# Patient Record
Sex: Female | Born: 1960 | Race: Black or African American | Hispanic: No | Marital: Married | Smoking: Former smoker
Health system: Southern US, Community
[De-identification: ages and names within clinical notes are randomized; demographics above are authoritative.]

## PROBLEM LIST (undated history)

## (undated) DIAGNOSIS — E119 Type 2 diabetes mellitus without complications: Secondary | ICD-10-CM

## (undated) DIAGNOSIS — M1990 Unspecified osteoarthritis, unspecified site: Secondary | ICD-10-CM

## (undated) DIAGNOSIS — K219 Gastro-esophageal reflux disease without esophagitis: Secondary | ICD-10-CM

## (undated) DIAGNOSIS — E669 Obesity, unspecified: Secondary | ICD-10-CM

## (undated) DIAGNOSIS — E7800 Pure hypercholesterolemia, unspecified: Secondary | ICD-10-CM

## (undated) DIAGNOSIS — C801 Malignant (primary) neoplasm, unspecified: Secondary | ICD-10-CM

## (undated) DIAGNOSIS — I10 Essential (primary) hypertension: Secondary | ICD-10-CM

## (undated) HISTORY — PX: BLADDER SURGERY: SHX569

## (undated) HISTORY — PX: ABDOMINAL SURGERY: SHX537

---

## 2016-07-25 ENCOUNTER — Emergency Department (HOSPITAL_BASED_OUTPATIENT_CLINIC_OR_DEPARTMENT_OTHER)
Admission: EM | Admit: 2016-07-25 | Discharge: 2016-07-25 | Disposition: A | Discharge status: Home / Self Care | Payer: Medicare Other | Attending: Emergency Medicine | Admitting: Emergency Medicine

## 2016-07-25 ENCOUNTER — Encounter (HOSPITAL_BASED_OUTPATIENT_CLINIC_OR_DEPARTMENT_OTHER): Payer: Self-pay | Admitting: Emergency Medicine

## 2016-07-25 DIAGNOSIS — J0100 Acute maxillary sinusitis, unspecified: Secondary | ICD-10-CM | POA: Diagnosis not present

## 2016-07-25 DIAGNOSIS — I10 Essential (primary) hypertension: Secondary | ICD-10-CM | POA: Diagnosis not present

## 2016-07-25 DIAGNOSIS — R079 Chest pain, unspecified: Secondary | ICD-10-CM | POA: Diagnosis not present

## 2016-07-25 DIAGNOSIS — Z87891 Personal history of nicotine dependence: Secondary | ICD-10-CM | POA: Diagnosis not present

## 2016-07-25 DIAGNOSIS — Z7984 Long term (current) use of oral hypoglycemic drugs: Secondary | ICD-10-CM | POA: Diagnosis not present

## 2016-07-25 DIAGNOSIS — Z859 Personal history of malignant neoplasm, unspecified: Secondary | ICD-10-CM | POA: Insufficient documentation

## 2016-07-25 DIAGNOSIS — E119 Type 2 diabetes mellitus without complications: Secondary | ICD-10-CM | POA: Insufficient documentation

## 2016-07-25 DIAGNOSIS — R05 Cough: Secondary | ICD-10-CM | POA: Diagnosis present

## 2016-07-25 HISTORY — DX: Gastro-esophageal reflux disease without esophagitis: K21.9

## 2016-07-25 HISTORY — DX: Pure hypercholesterolemia, unspecified: E78.00

## 2016-07-25 HISTORY — DX: Unspecified osteoarthritis, unspecified site: M19.90

## 2016-07-25 HISTORY — DX: Essential (primary) hypertension: I10

## 2016-07-25 HISTORY — DX: Malignant (primary) neoplasm, unspecified: C80.1

## 2016-07-25 HISTORY — DX: Obesity, unspecified: E66.9

## 2016-07-25 HISTORY — DX: Type 2 diabetes mellitus without complications: E11.9

## 2016-07-25 MED ORDER — IBUPROFEN 800 MG PO TABS
800.0000 mg | ORAL_TABLET | Freq: Once | ORAL | Status: AC
  Administered 2016-07-25: 800 mg via ORAL
  Filled 2016-07-25: qty 1

## 2016-07-25 MED ORDER — ACETAMINOPHEN 500 MG PO TABS
1000.0000 mg | ORAL_TABLET | Freq: Once | ORAL | Status: AC
  Administered 2016-07-25: 1000 mg via ORAL
  Filled 2016-07-25: qty 2

## 2016-07-25 MED ORDER — HYDROCOD POLST-CPM POLST ER 10-8 MG/5ML PO SUER
5.0000 mL | Freq: Once | ORAL | Status: AC
  Administered 2016-07-25: 5 mL via ORAL
  Filled 2016-07-25: qty 5

## 2016-07-25 MED ORDER — AMOXICILLIN-POT CLAVULANATE 875-125 MG PO TABS
1.0000 | ORAL_TABLET | Freq: Once | ORAL | Status: AC
  Administered 2016-07-25: 1 via ORAL
  Filled 2016-07-25: qty 1

## 2016-07-25 MED ORDER — AMOXICILLIN-POT CLAVULANATE 875-125 MG PO TABS: 1.0000 | ORAL_TABLET | Freq: Two times a day (BID) | ORAL | 0 refills | Status: DC

## 2016-07-25 NOTE — ED Notes (Signed)
Pt ambulated here in ED. HR 114, SpO2 96% without any breathing difficulty and no increase WOB.

## 2016-07-25 NOTE — ED Provider Notes (Signed)
[facility] DEPT [facility] Provider Note   CSN: [id number] Arrival date & time: 07/25/16  1744  By signing my name below, I, Dora Sims, attest that this documentation has been prepared under the direction and in the presence of physician practitioner, Deno Etienne, DO. Electronically Signed: Dora Sims, Scribe. 07/25/2016. 6:24 PM.  History   Chief Complaint Chief Complaint  Patient presents with  . Cough    The history is provided by the patient. No language interpreter was used.  Cough  This is a new problem. The current episode started more than 1 week ago. The problem occurs every few minutes. The problem has been gradually worsening. The cough is non-productive. Maximum temperature: subjective. The fever has been present for 5 days or more (intermittent). Associated symptoms include chest pain (secondary to cough), chills, sweats, sore throat and myalgias (generalized). Pertinent negatives include no ear pain and no shortness of breath. Treatments tried: TheraFlu. The treatment provided no relief. She is not a smoker (formerly).     HPI Comments: Tina Barker is a 56 y.o. female who presents to the Emergency Department complaining of an intermittent, worsening, dry cough for the last 3 weeks. She notes associated sore throat, fever, chills, diaphoresis, sinus pain, generalized myalgias, and chest pain secondary to her cough. Pt notes her cough has worsened most significantly this past week. She has tried TheraFlu with no improvement of her symptoms. Pt reports bilateral lower leg swelling at baseline and notes no acute change in this condition; she did not take her fluid pills today. She denies ear pain, nausea, vomiting, SOB, or any other associated symptoms.  Past Medical History:  Diagnosis Date  . Arthritis   . Cancer ([place])   . Diabetes mellitus without complication ([place])   . GERD (gastroesophageal reflux disease)   . High cholesterol   . Hypertension   . Obesity     There  are no active problems to display for this patient.   Past Surgical History:  Procedure Laterality Date  . ABDOMINAL SURGERY    . BLADDER SURGERY      OB History    No data available       Home Medications    Prior to Admission medications   Medication Sig Start Date End Date Taking? Authorizing Provider  metFORMIN (GLUCOPHAGE) 1000 MG tablet Take 1,000 mg by mouth 2 (two) times daily with a meal.   Yes Historical Provider, MD  amoxicillin-clavulanate (AUGMENTIN) 875-125 MG tablet Take 1 tablet by mouth every 12 (twelve) hours. 07/25/16   Deno Etienne, DO    Family History No family history on file.  Social History Social History  Substance Use Topics  . Smoking status: Former Research scientist (life sciences)  . Smokeless tobacco: Never Used  . Alcohol use No     Allergies   Patient has no known allergies.   Review of Systems Review of Systems  Constitutional: Positive for chills, diaphoresis and fever.  HENT: Positive for sinus pain and sore throat. Negative for ear pain.   Respiratory: Positive for cough. Negative for shortness of breath.   Cardiovascular: Positive for chest pain (secondary to cough) and leg swelling (bilateral, baseline).  Gastrointestinal: Negative for nausea and vomiting.  Musculoskeletal: Positive for myalgias (generalized).  All other systems reviewed and are negative.    Physical Exam Updated Vital Signs BP 146/89 (BP Location: Right Arm)   Pulse 94   Temp 99.4 F (37.4 C) (Oral)   Resp 20   Ht 5\' 8"  (1.727 m)  Wt (!) 304 lb (137.9 kg)   SpO2 97%   BMI 46.22 kg/m   Physical Exam  Constitutional: She is oriented to person, place, and time. She appears well-developed and well-nourished. No distress.  HENT:  Head: Normocephalic and atraumatic.  Left Ear: Tympanic membrane is erythematous. A middle ear effusion is present.  Nose: Right sinus exhibits maxillary sinus tenderness.  Swollen turbinates. Postnasal drip. Tenderness to percussion of right  maxillary sinus. Left TM has an effusion and is erythematous.   Eyes: EOM are normal. Pupils are equal, round, and reactive to light.  Neck: Normal range of motion. Neck supple.  Cardiovascular: Normal rate and regular rhythm.  Exam reveals no gallop and no friction rub.   No murmur heard. Pulmonary/Chest: Effort normal. She has no wheezes. She has no rales.  Clear lungs.  Abdominal: Soft. She exhibits no distension. There is no tenderness.  Musculoskeletal: She exhibits no edema or tenderness.  Neurological: She is alert and oriented to person, place, and time.  Skin: Skin is warm and dry. She is not diaphoretic.  Psychiatric: She has a normal mood and affect. Her behavior is normal.  Nursing note and vitals reviewed.    ED Treatments / Results  Labs (all labs ordered are listed, but only abnormal results are displayed) Labs Reviewed - No data to display  EKG  EKG Interpretation None       Radiology No results found.  Procedures Procedures (including critical care time)  DIAGNOSTIC STUDIES: Oxygen Saturation is 93% on RA, adequate by my interpretation.    COORDINATION OF CARE: 6:29 PM Discussed treatment plan with pt at bedside and pt agreed to plan.  Medications Ordered in ED Medications  amoxicillin-clavulanate (AUGMENTIN) 875-125 MG per tablet 1 tablet (1 tablet Oral Given 07/25/16 1910)  chlorpheniramine-HYDROcodone (TUSSIONEX) 10-8 MG/5ML suspension 5 mL (5 mLs Oral Given 07/25/16 1911)  acetaminophen (TYLENOL) tablet 1,000 mg (1,000 mg Oral Given 07/25/16 1910)  ibuprofen (ADVIL,MOTRIN) tablet 800 mg (800 mg Oral Given 07/25/16 1910)     Initial Impression / Assessment and Plan / ED Course  I have reviewed the triage vital signs and the nursing notes.  Pertinent labs & imaging results that were available during my care of the patient were reviewed by me and considered in my medical decision making (see chart for details).  Clinical Course     56 yo F with a cc  of a cough.  Going on for 3 weeks. Having severe sinus tenderness on exam. Start on abx.   11:33 PM:  I have discussed the diagnosis/risks/treatment options with the patient and family and believe the pt to be eligible for discharge home to follow-up with . We also discussed returning to the ED immediately if new or worsening sx occur. We discussed the sx which are most concerning (e.g., sudden worsening pain, fever, inability to tolerate by mouth) that necessitate immediate return. Medications administered to the patient during their visit and any new prescriptions provided to the patient are listed below.  Medications given during this visit Medications  amoxicillin-clavulanate (AUGMENTIN) 875-125 MG per tablet 1 tablet (1 tablet Oral Given 07/25/16 1910)  chlorpheniramine-HYDROcodone (TUSSIONEX) 10-8 MG/5ML suspension 5 mL (5 mLs Oral Given 07/25/16 1911)  acetaminophen (TYLENOL) tablet 1,000 mg (1,000 mg Oral Given 07/25/16 1910)  ibuprofen (ADVIL,MOTRIN) tablet 800 mg (800 mg Oral Given 07/25/16 1910)     The patient appears reasonably screen and/or stabilized for discharge and I doubt any other medical condition or other [facility]  requiring further screening, evaluation, or treatment in the ED at this time prior to discharge.    Final Clinical Impressions(s) / ED Diagnoses   Final diagnoses:  Acute non-recurrent maxillary sinusitis    New Prescriptions Discharge Medication List as of 07/25/2016  7:13 PM    START taking these medications   Details  amoxicillin-clavulanate (AUGMENTIN) 875-125 MG tablet Take 1 tablet by mouth every 12 (twelve) hours., Starting Tue 07/25/2016, Print       I personally performed the services described in this documentation, which was scribed in my presence. The recorded information has been reviewed and is accurate.     Deno Etienne, DO 07/25/16 2333

## 2016-07-25 NOTE — Discharge Instructions (Signed)
Take tylenol 2 pills 4 times a day and motrin 4 pills 3 times a day.  Drink plenty of fluids.  Return for worsening shortness of breath, headache, confusion. Follow up with your family doctor.   

## 2016-07-25 NOTE — ED Triage Notes (Signed)
Sinus congestion and runny nose with cough and sore throat x3 weeks. Taking OTC meds.  Has not seen pmd.

## 2016-07-31 ENCOUNTER — Emergency Department (HOSPITAL_BASED_OUTPATIENT_CLINIC_OR_DEPARTMENT_OTHER)
Admission: EM | Admit: 2016-07-31 | Discharge: 2016-07-31 | Disposition: A | Discharge status: Home / Self Care | Payer: Medicare Other | Attending: Emergency Medicine | Admitting: Emergency Medicine

## 2016-07-31 ENCOUNTER — Emergency Department (HOSPITAL_BASED_OUTPATIENT_CLINIC_OR_DEPARTMENT_OTHER): Payer: Medicare Other

## 2016-07-31 ENCOUNTER — Encounter (HOSPITAL_BASED_OUTPATIENT_CLINIC_OR_DEPARTMENT_OTHER): Payer: Self-pay | Admitting: *Deleted

## 2016-07-31 DIAGNOSIS — I10 Essential (primary) hypertension: Secondary | ICD-10-CM | POA: Diagnosis not present

## 2016-07-31 DIAGNOSIS — Z87891 Personal history of nicotine dependence: Secondary | ICD-10-CM | POA: Diagnosis not present

## 2016-07-31 DIAGNOSIS — Z859 Personal history of malignant neoplasm, unspecified: Secondary | ICD-10-CM | POA: Insufficient documentation

## 2016-07-31 DIAGNOSIS — Z79899 Other long term (current) drug therapy: Secondary | ICD-10-CM | POA: Insufficient documentation

## 2016-07-31 DIAGNOSIS — R05 Cough: Secondary | ICD-10-CM

## 2016-07-31 DIAGNOSIS — J40 Bronchitis, not specified as acute or chronic: Secondary | ICD-10-CM | POA: Diagnosis not present

## 2016-07-31 DIAGNOSIS — R059 Cough, unspecified: Secondary | ICD-10-CM

## 2016-07-31 DIAGNOSIS — E119 Type 2 diabetes mellitus without complications: Secondary | ICD-10-CM | POA: Insufficient documentation

## 2016-07-31 DIAGNOSIS — Z7984 Long term (current) use of oral hypoglycemic drugs: Secondary | ICD-10-CM | POA: Insufficient documentation

## 2016-07-31 NOTE — ED Notes (Signed)
ED Provider at bedside. 

## 2016-07-31 NOTE — ED Provider Notes (Signed)
[facility] DEPT [facility] Provider Note   CSN: [id number] Arrival date & time: 07/31/16  1517  By signing my name below, I, Arianna Nassar, attest that this documentation has been prepared under the direction and in the presence of Fatima Blank, MD.  Electronically Signed: Julien Nordmann, ED Scribe. 07/31/16. 6:04 PM.    History   Chief Complaint Chief Complaint  Patient presents with  . Cough   The history is provided by the patient. No language interpreter was used.   HPI Comments: Tina Barker is a 56 y.o. female who has a PMhx of cancer, asthma DM, GERD, high cholesterol and HTN presents to the Emergency Department complaining of a constant, gradual worsening productive cough x 4 weeks that became progressively worse last night. She reports associated rhinorrhea, congestion, bilateral leg swelling, and chest pain when coughing. Pt was seen on 01/09 for the same cough in which she was diagnosed with acute non-recurrent sinusitis where she was prescribed augmentin and notes she is still taking them. Pt says her symptoms have not gotten any better with the antibiotics taken. She reports feeling short of breath when laying down. She is currently compliant with her lasix. Pt denies chest pain or fever. Pt is a non-smoker.  Past Medical History:  Diagnosis Date  . Arthritis   . Cancer ([place])   . Diabetes mellitus without complication ([place])   . GERD (gastroesophageal reflux disease)   . High cholesterol   . Hypertension   . Obesity     There are no active problems to display for this patient.   Past Surgical History:  Procedure Laterality Date  . ABDOMINAL SURGERY    . BLADDER SURGERY      OB History    No data available       Home Medications    Prior to Admission medications   Medication Sig Start Date End Date Taking? Authorizing Provider  atorvastatin (LIPITOR) 40 MG tablet Take 40 mg by mouth daily.   Yes Historical Provider, MD  furosemide (LASIX) 40 MG  tablet Take 40 mg by mouth.   Yes Historical Provider, MD  lisinopril (PRINIVIL,ZESTRIL) 40 MG tablet Take 40 mg by mouth daily.   Yes Historical Provider, MD  NIFEdipine (PROCARDIA-XL/ADALAT CC) 60 MG 24 hr tablet Take 60 mg by mouth daily.   Yes Historical Provider, MD  ranitidine (ZANTAC) 150 MG capsule Take 150 mg by mouth 2 (two) times daily.   Yes Historical Provider, MD  traMADol (ULTRAM) 50 MG tablet Take by mouth every 6 (six) hours as needed.   Yes Historical Provider, MD  amoxicillin-clavulanate (AUGMENTIN) 875-125 MG tablet Take 1 tablet by mouth every 12 (twelve) hours. 07/25/16   Deno Etienne, DO  metFORMIN (GLUCOPHAGE) 1000 MG tablet Take 1,000 mg by mouth 2 (two) times daily with a meal.    Historical Provider, MD    Family History History reviewed. No pertinent family history.  Social History Social History  Substance Use Topics  . Smoking status: Former Research scientist (life sciences)  . Smokeless tobacco: Never Used  . Alcohol use No     Allergies   Patient has no known allergies.   Review of Systems Review of Systems  A complete 10 system review of systems was obtained and all systems are negative except as noted in the HPI and PMH.   Physical Exam Updated Vital Signs Initial vitals: BP 157/89 (BP Location: Right Arm)   Pulse 87   Temp 98 F (36.7 C) (Oral)   Resp  20   Ht 5\' 8"  (1.727 m)   Wt 300 lb (136.1 kg)   SpO2 98%   BMI 45.61 kg/m   Physical Exam  Constitutional: She is oriented to person, place, and time. She appears well-developed and well-nourished. No distress.  HENT:  Head: Normocephalic and atraumatic.  Nose: Mucosal edema present.  Eyes: Conjunctivae and EOM are normal. Pupils are equal, round, and reactive to light. Right eye exhibits no discharge. Left eye exhibits no discharge. No scleral icterus.  Neck: Normal range of motion. Neck supple.  Cardiovascular: Normal rate and regular rhythm.  Exam reveals no gallop and no friction rub.   No murmur  heard. Pulmonary/Chest: Effort normal and breath sounds normal. No stridor. No respiratory distress. She has no rales.  Abdominal: Soft. She exhibits no distension. There is no tenderness.  Musculoskeletal: She exhibits no edema or tenderness.  Neurological: She is alert and oriented to person, place, and time.  Skin: Skin is warm and dry. No rash noted. She is not diaphoretic. No erythema.  Psychiatric: She has a normal mood and affect.  Vitals reviewed.    ED Treatments / Results  DIAGNOSTIC STUDIES: Oxygen Saturation is 98% on RA, normal by my interpretation.  COORDINATION OF CARE:  5:59 PM Discussed treatment plan with pt at bedside and pt agreed to plan.  Labs (all labs ordered are listed, but only abnormal results are displayed) Labs Reviewed - No data to display  EKG  EKG Interpretation None       Radiology Dg Chest 2 View  Result Date: 07/31/2016 CLINICAL DATA:  Cough, fever, congestion for 3 weeks. History of hypertension, diabetes and cancer. EXAM: CHEST  2 VIEW COMPARISON:  None. FINDINGS: Cardiomediastinal silhouette is normal. No pleural effusions or focal consolidations. Trachea projects midline and there is no pneumothorax. Soft tissue planes and included osseous structures are non-suspicious. IMPRESSION: Normal chest. Electronically Signed   By: Elon Alas M.D.   On: 07/31/2016 15:58    Procedures Procedures (including critical care time)  Medications Ordered in ED Medications - No data to display   Initial Impression / Assessment and Plan / ED Course  I have reviewed the triage vital signs and the nursing notes.  Pertinent labs & imaging results that were available during my care of the patient were reviewed by me and considered in my medical decision making (see chart for details).  Clinical Course     Presentation consistent with bronchitis. Chest x-ray without evidence of pneumonia. Patient already on Augmentin from previous visit.  Instructed to complete antibiotic treatment.  The patient is safe for discharge with strict return precautions.   Final Clinical Impressions(s) / ED Diagnoses   Final diagnoses:  Cough  Bronchitis   Disposition: Discharge  Condition: Good  I have discussed the results, Dx and Tx plan with the patient who expressed understanding and agree(s) with the plan. Discharge instructions discussed at great length. The patient was given strict return precautions who verbalized understanding of the instructions. No further questions at time of discharge.    Discharge Medication List as of 07/31/2016  6:16 PM      Follow Up: Deborah Chalk, FNP [place] [id number] [place] [address] [phone]  Schedule an appointment as soon as possible for a visit  in 5-7 days, If symptoms do not improve or  worsen   I personally performed the services described in this documentation, which was scribed in my presence. The recorded information has been  reviewed and is accurate.        Fatima Blank, MD 08/02/16 [phone]

## 2016-07-31 NOTE — ED Triage Notes (Signed)
Pt reports cough and URI x 1 week.  Been to ED and treated.

## 2019-01-08 ENCOUNTER — Emergency Department (HOSPITAL_BASED_OUTPATIENT_CLINIC_OR_DEPARTMENT_OTHER)
Admission: EM | Admit: 2019-01-08 | Discharge: 2019-01-08 | Disposition: A | Discharge status: Home / Self Care | Payer: Medicare Other | Attending: Emergency Medicine | Admitting: Emergency Medicine

## 2019-01-08 ENCOUNTER — Other Ambulatory Visit: Payer: Self-pay

## 2019-01-08 ENCOUNTER — Emergency Department (HOSPITAL_BASED_OUTPATIENT_CLINIC_OR_DEPARTMENT_OTHER): Payer: Medicare Other

## 2019-01-08 ENCOUNTER — Encounter (HOSPITAL_BASED_OUTPATIENT_CLINIC_OR_DEPARTMENT_OTHER): Payer: Self-pay | Admitting: *Deleted

## 2019-01-08 DIAGNOSIS — Z79899 Other long term (current) drug therapy: Secondary | ICD-10-CM | POA: Diagnosis not present

## 2019-01-08 DIAGNOSIS — K5792 Diverticulitis of intestine, part unspecified, without perforation or abscess without bleeding: Secondary | ICD-10-CM

## 2019-01-08 DIAGNOSIS — Z7984 Long term (current) use of oral hypoglycemic drugs: Secondary | ICD-10-CM | POA: Diagnosis not present

## 2019-01-08 DIAGNOSIS — Z87891 Personal history of nicotine dependence: Secondary | ICD-10-CM | POA: Insufficient documentation

## 2019-01-08 DIAGNOSIS — K5732 Diverticulitis of large intestine without perforation or abscess without bleeding: Secondary | ICD-10-CM | POA: Diagnosis not present

## 2019-01-08 DIAGNOSIS — R109 Unspecified abdominal pain: Secondary | ICD-10-CM

## 2019-01-08 DIAGNOSIS — E119 Type 2 diabetes mellitus without complications: Secondary | ICD-10-CM | POA: Diagnosis not present

## 2019-01-08 DIAGNOSIS — I10 Essential (primary) hypertension: Secondary | ICD-10-CM | POA: Insufficient documentation

## 2019-01-08 DIAGNOSIS — R1032 Left lower quadrant pain: Secondary | ICD-10-CM | POA: Diagnosis present

## 2019-01-08 LAB — CBC WITH DIFFERENTIAL/PLATELET
Abs Immature Granulocytes: 0.03 10*3/uL (ref 0.00–0.07)
Basophils Absolute: 0.1 10*3/uL (ref 0.0–0.1)
Basophils Relative: 1 %
Eosinophils Absolute: 0.1 10*3/uL (ref 0.0–0.5)
Eosinophils Relative: 2 %
HCT: 30.9 % — ABNORMAL LOW (ref 36.0–46.0)
Hemoglobin: 9.6 g/dL — ABNORMAL LOW (ref 12.0–15.0)
Immature Granulocytes: 0 %
Lymphocytes Relative: 28 %
Lymphs Abs: 2 10*3/uL (ref 0.7–4.0)
MCH: 31 pg (ref 26.0–34.0)
MCHC: 31.1 g/dL (ref 30.0–36.0)
MCV: 99.7 fL (ref 80.0–100.0)
Monocytes Absolute: 0.6 10*3/uL (ref 0.1–1.0)
Monocytes Relative: 8 %
Neutro Abs: 4.5 10*3/uL (ref 1.7–7.7)
Neutrophils Relative %: 61 %
Platelets: 213 10*3/uL (ref 150–400)
RBC: 3.1 MIL/uL — ABNORMAL LOW (ref 3.87–5.11)
RDW: 13.4 % (ref 11.5–15.5)
WBC: 7.4 10*3/uL (ref 4.0–10.5)
nRBC: 0 % (ref 0.0–0.2)

## 2019-01-08 LAB — COMPREHENSIVE METABOLIC PANEL
ALT: 31 U/L (ref 0–44)
AST: 43 U/L — ABNORMAL HIGH (ref 15–41)
Albumin: 4 g/dL (ref 3.5–5.0)
Alkaline Phosphatase: 75 U/L (ref 38–126)
Anion gap: 7 (ref 5–15)
BUN: 38 mg/dL — ABNORMAL HIGH (ref 6–20)
CO2: 21 mmol/L — ABNORMAL LOW (ref 22–32)
Calcium: 9.4 mg/dL (ref 8.9–10.3)
Chloride: 112 mmol/L — ABNORMAL HIGH (ref 98–111)
Creatinine, Ser: 1.99 mg/dL — ABNORMAL HIGH (ref 0.44–1.00)
GFR calc Af Amer: 31 mL/min — ABNORMAL LOW (ref >60)
GFR calc non Af Amer: 27 mL/min — ABNORMAL LOW (ref >60)
Glucose, Bld: 113 mg/dL — ABNORMAL HIGH (ref 70–99)
Potassium: 5.1 mmol/L (ref 3.5–5.1)
Sodium: 140 mmol/L (ref 135–145)
Total Bilirubin: 0.9 mg/dL (ref 0.3–1.2)
Total Protein: 7.1 g/dL (ref 6.5–8.1)

## 2019-01-08 LAB — URINALYSIS, ROUTINE W REFLEX MICROSCOPIC
Bilirubin Urine: NEGATIVE
Glucose, UA: NEGATIVE mg/dL
Hgb urine dipstick: NEGATIVE
Ketones, ur: NEGATIVE mg/dL
Leukocytes,Ua: NEGATIVE
Nitrite: NEGATIVE
Protein, ur: 30 mg/dL — AB
Specific Gravity, Urine: 1.02 (ref 1.005–1.030)
pH: 5.5 (ref 5.0–8.0)

## 2019-01-08 LAB — URINALYSIS, MICROSCOPIC (REFLEX)

## 2019-01-08 MED ORDER — ONDANSETRON 4 MG PO TBDP: 4.0000 mg | ORAL_TABLET | Freq: Three times a day (TID) | ORAL | 0 refills | Status: AC | PRN

## 2019-01-08 MED ORDER — HYDROCODONE-ACETAMINOPHEN 5-325 MG PO TABS
1.0000 | ORAL_TABLET | Freq: Once | ORAL | Status: AC
  Administered 2019-01-08: 08:00:00 1 via ORAL
  Filled 2019-01-08: qty 1

## 2019-01-08 MED ORDER — AMOXICILLIN-POT CLAVULANATE 875-125 MG PO TABS: 1.0000 | ORAL_TABLET | Freq: Two times a day (BID) | ORAL | 0 refills | Status: AC

## 2019-01-08 MED ORDER — HYDROCODONE-ACETAMINOPHEN 5-325 MG PO TABS: 2.0000 | ORAL_TABLET | Freq: Three times a day (TID) | ORAL | 0 refills | Status: AC | PRN

## 2019-01-08 NOTE — ED Provider Notes (Signed)
[place] EMERGENCY DEPARTMENT Provider Note   CSN: [id number] Arrival date & time: 01/08/19  2353    History   Chief Complaint Chief Complaint  Patient presents with  . Abdominal Pain    HPI Tina Barker is a 58 y.o. female.     The history is provided by the patient and medical records. No language interpreter was used.  Abdominal Pain  Tina Barker is a 57 y.o. female who presents to the Emergency Department complaining of flank pain. She presents to the emergency department complaining of sharp, left-sided flank pain that began two days ago. Pain was initially constant in nature but now it is waxing and waning. It is nonradiating. She states it is in her kidney region. She denies any fevers, chest pain, shortness of breath, nausea, vomiting, dysuria, hematuria. She has a history of stage III CKD and is concerned this is a worsening in her kidney function. At times her pain is worse with movement but not consistently. She denies any injuries. Symptoms are moderate in nature. Past Medical History:  Diagnosis Date  . Arthritis   . Cancer ([place])   . Diabetes mellitus without complication ([place])   . GERD (gastroesophageal reflux disease)   . High cholesterol   . Hypertension   . Obesity     There are no active problems to display for this patient.   Past Surgical History:  Procedure Laterality Date  . ABDOMINAL SURGERY    . BLADDER SURGERY       OB History   No obstetric history on file.      Home Medications    Prior to Admission medications   Medication Sig Start Date End Date Taking? Authorizing Provider  amoxicillin-clavulanate (AUGMENTIN) 875-125 MG tablet Take 1 tablet by mouth every 12 (twelve) hours. 01/08/19   Quintella Reichert, MD  atorvastatin (LIPITOR) 40 MG tablet Take 40 mg by mouth daily.    [provider]  furosemide (LASIX) 40 MG tablet Take 40 mg by mouth.    [provider]  HYDROcodone-acetaminophen (NORCO/VICODIN)  5-325 MG tablet Take 2 tablets by mouth every 8 (eight) hours as needed. 01/08/19   Quintella Reichert, MD  lisinopril (PRINIVIL,ZESTRIL) 40 MG tablet Take 40 mg by mouth daily.    [provider]  metFORMIN (GLUCOPHAGE) 1000 MG tablet Take 1,000 mg by mouth 2 (two) times daily with a meal.    [provider]  NIFEdipine (PROCARDIA-XL/ADALAT CC) 60 MG 24 hr tablet Take 60 mg by mouth daily.    [provider]  ondansetron (ZOFRAN ODT) 4 MG disintegrating tablet Take 1 tablet (4 mg total) by mouth every 8 (eight) hours as needed for nausea or vomiting. 01/08/19   Quintella Reichert, MD  ranitidine (ZANTAC) 150 MG capsule Take 150 mg by mouth 2 (two) times daily.    [provider]  traMADol (ULTRAM) 50 MG tablet Take by mouth every 6 (six) hours as needed.    [provider]    Family History History reviewed. No pertinent family history.  Social History Social History   Tobacco Use  . Smoking status: Former Research scientist (life sciences)  . Smokeless tobacco: Never Used  Substance Use Topics  . Alcohol use: No  . Drug use: No     Allergies   Patient has no known allergies.   Review of Systems Review of Systems  Gastrointestinal: Positive for abdominal pain.  All other systems reviewed and are negative.    Physical Exam Updated Vital Signs  BP (!) 172/91 (BP Location: Right Arm)   Pulse 86   Temp 97.9 F (36.6 C) (Oral)   Resp 18   Ht 5\' 8"  (1.727 m)   Wt 120.2 kg   SpO2 100%   BMI 40.29 kg/m   Physical Exam Vitals signs and nursing note reviewed.  Constitutional:      Appearance: She is well-developed.  HENT:     Head: Normocephalic and atraumatic.  Cardiovascular:     Rate and Rhythm: Normal rate and regular rhythm.  Pulmonary:     Effort: Pulmonary effort is normal. No respiratory distress.  Abdominal:     Palpations: Abdomen is soft.     Tenderness: There is no abdominal tenderness. There is no guarding or rebound.     Comments: Left CVA  tenderness  Musculoskeletal:        General: No tenderness.     Comments: No midline thoracic or lumbar tenderness to palpation. 2+ DP pulses bilaterally. There is non-pitting edema to bilateral lower extremities  Skin:    General: Skin is warm and dry.  Neurological:     Mental Status: She is alert and oriented to person, place, and time.     Comments: Five out of five strength in all four extremities with sensation to light touch intact in all four extremities  Psychiatric:        Mood and Affect: Mood normal.        Behavior: Behavior normal.      ED Treatments / Results  Labs (all labs ordered are listed, but only abnormal results are displayed) Labs Reviewed  COMPREHENSIVE METABOLIC PANEL - Abnormal; Notable for the following components:      Result Value   Chloride 112 (*)    CO2 21 (*)    Glucose, Bld 113 (*)    BUN 38 (*)    Creatinine, Ser 1.99 (*)    AST 43 (*)    GFR calc non Af Amer 27 (*)    GFR calc Af Amer 31 (*)    All other components within normal limits  CBC WITH DIFFERENTIAL/PLATELET - Abnormal; Notable for the following components:   RBC 3.10 (*)    Hemoglobin 9.6 (*)    HCT 30.9 (*)    All other components within normal limits  URINALYSIS, ROUTINE W REFLEX MICROSCOPIC - Abnormal; Notable for the following components:   Protein, ur 30 (*)    All other components within normal limits  URINALYSIS, MICROSCOPIC (REFLEX) - Abnormal; Notable for the following components:   Bacteria, UA FEW (*)    All other components within normal limits    EKG None  Radiology Ct Renal Stone Study  Result Date: 01/08/2019 CLINICAL DATA:  Left flank pain for a few days EXAM: CT ABDOMEN AND PELVIS WITHOUT CONTRAST TECHNIQUE: Multidetector CT imaging of the abdomen and pelvis was performed following the standard protocol without IV contrast. COMPARISON:  09/07/2017 FINDINGS: Lower chest:  Coronary atherosclerotic calcification Hepatobiliary: No focal liver abnormality.No  evidence of biliary obstruction or stone. Pancreas: Unremarkable. Spleen: Unremarkable. Adrenals/Urinary Tract: Negative adrenals. No hydronephrosis or ureteral stone. 17 mm left lower pole calculus. Small cystic density in the lower pole left kidney. Unremarkable bladder. Stomach/Bowel: Numerous colonic diverticula distally. A descending colonic diverticulum is thickened and there is regional fat inflammation and peritoneal thickening. No abscess or pneumoperitoneum Right hemicolectomy.  Negative for bowel obstruction Vascular/Lymphatic: Atherosclerotic calcification of the aorta and iliacs. No mass or adenopathy. Reproductive:No pathologic findings. Other: No  ascites or pneumoperitoneum.  Fatty right inguinal hernia Musculoskeletal: No acute abnormalities. Spinal degeneration with mild L4-5 anterolisthesis. IMPRESSION: 1. Uncomplicated descending diverticulitis. 2. Left colonic diverticulosis is extensive. There has been right hemicolectomy. 3. 17 mm left renal calculus. 4. Atherosclerosis, including the coronary arteries. 5. Fatty right inguinal hernia. Electronically Signed   By: Monte Fantasia M.D.   On: 01/08/2019 08:43    Procedures Procedures (including critical care time)  Medications Ordered in ED Medications  HYDROcodone-acetaminophen (NORCO/VICODIN) 5-325 MG per tablet 1 tablet (1 tablet Oral Given 01/08/19 0817)     Initial Impression / Assessment and Plan / ED Course  I have reviewed the triage vital signs and the nursing notes.  Pertinent labs & imaging results that were available during my care of the patient were reviewed by me and considered in my medical decision making (see chart for details).        Patient here for evaluation of left flank pain for the last few days. She is non-toxic appearing on evaluation with no significant abdominal tenderness. Sensation is not consistent with sepsis, cauda equina, dissection. Imaging is significant for non-obstructive calculus, acute  diverticulitis without complication. Labs demonstrate stable renal insufficiency and anemia when reviewed in care everywhere. Patient's pain is improved following hydrocodone in the emergency department. Discussed with patient findings of imaging with diverticulitis as well as non-obstructive kidney stone, atherosclerosis. Discussed importance of outpatient follow-up as well as return precautions.  Final Clinical Impressions(s) / ED Diagnoses   Final diagnoses:  Acute diverticulitis  Flank pain    ED Discharge Orders         Ordered    amoxicillin-clavulanate (AUGMENTIN) 875-125 MG tablet  Every 12 hours     01/08/19 0959    ondansetron (ZOFRAN ODT) 4 MG disintegrating tablet  Every 8 hours PRN     01/08/19 0959    HYDROcodone-acetaminophen (NORCO/VICODIN) 5-325 MG tablet  Every 8 hours PRN     01/08/19 0959           Quintella Reichert, MD 01/08/19 1000

## 2019-09-06 IMAGING — CT CT RENAL STONE PROTOCOL
2 of 4 series · 16 of 46 positions shown, 18 images · non-contrast
Comparison: 09/07/2017

CLINICAL DATA: Left flank pain for a few days

EXAM:
CT ABDOMEN AND PELVIS WITHOUT CONTRAST
TECHNIQUE: Multidetector CT imaging of the abdomen and pelvis was performed
following the standard protocol without IV contrast.

[Series 2: axial st · axial · 0.98mm/px · z∈[-541,-76]mm · 13 of 103 slices shown, 15 images]
[im 5/103  soft-tissue]
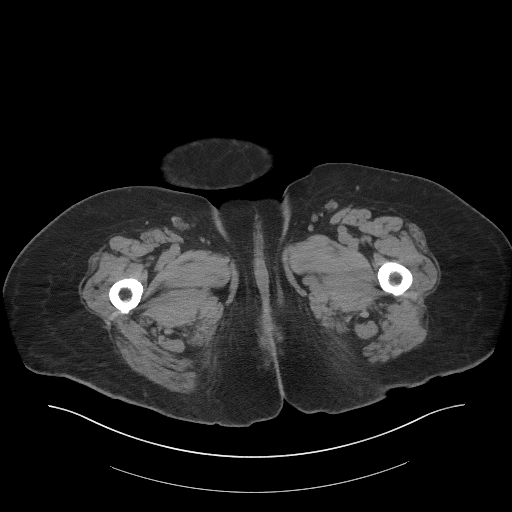
[im 5/103  bone]
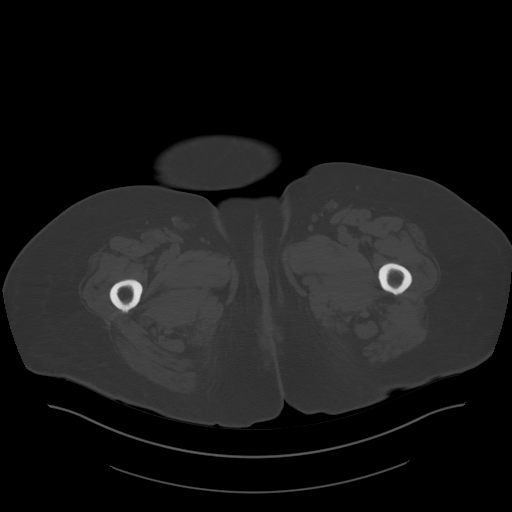
[im 14/103  soft-tissue]
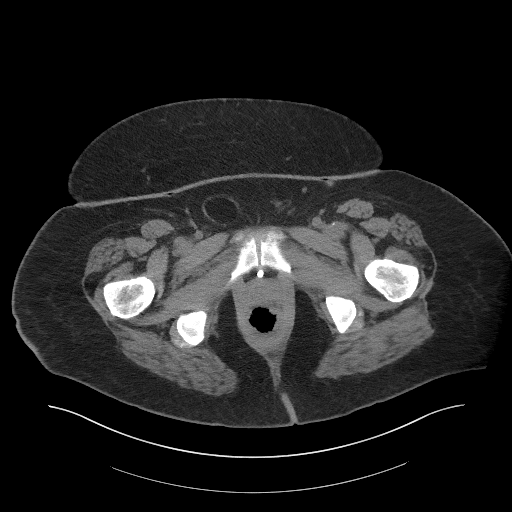
[im 23/103  soft-tissue]
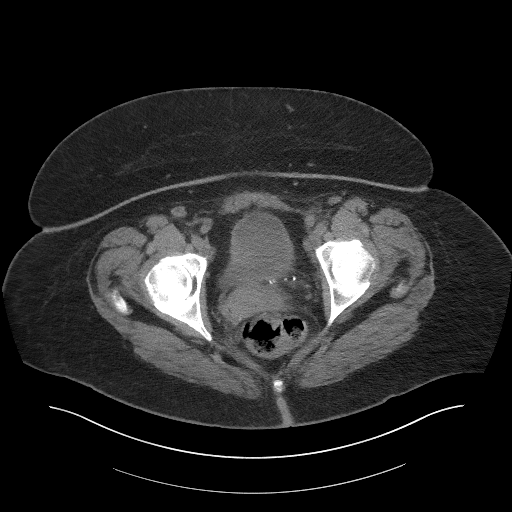
[im 27/103  soft-tissue]
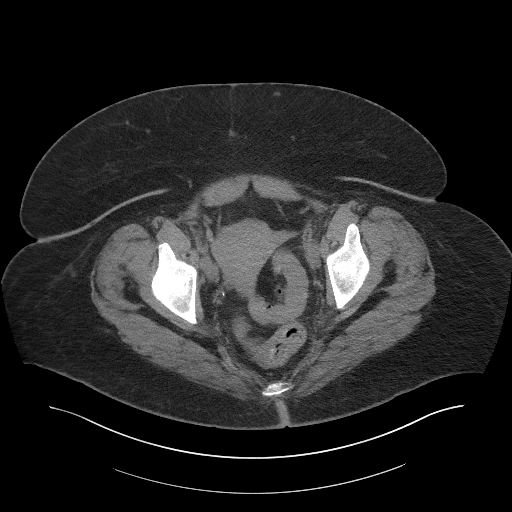
[im 36/103  soft-tissue]
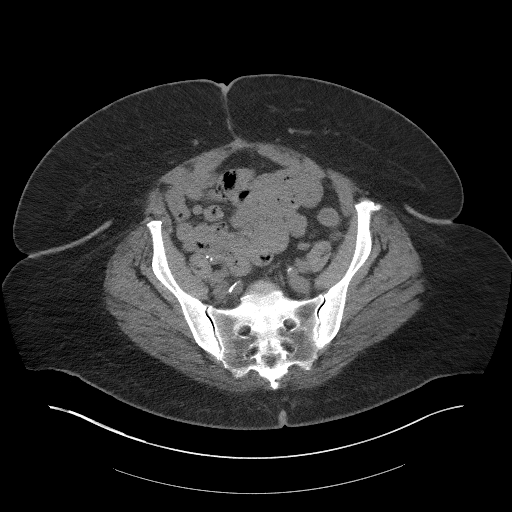
[im 45/103  soft-tissue]
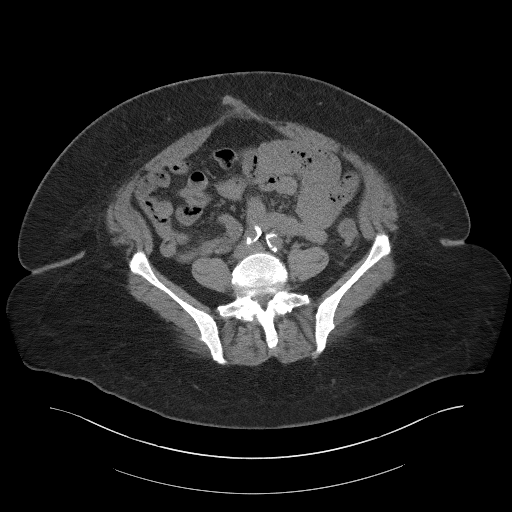
[im 54/103  soft-tissue]
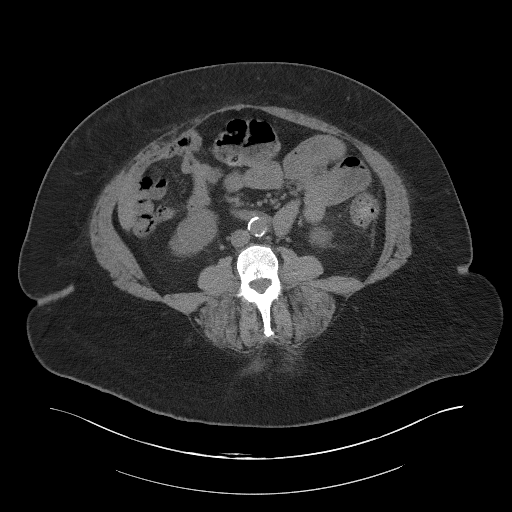
[im 58/103  soft-tissue]
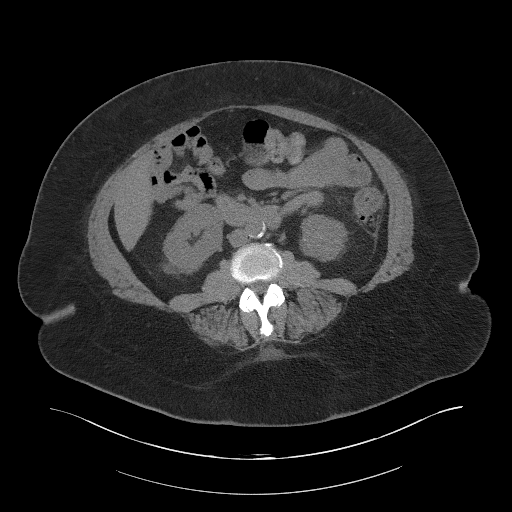
[im 67/103  soft-tissue]
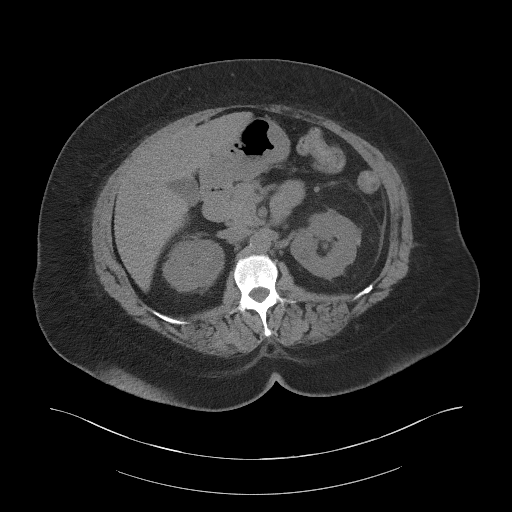
[im 67/103  bone]
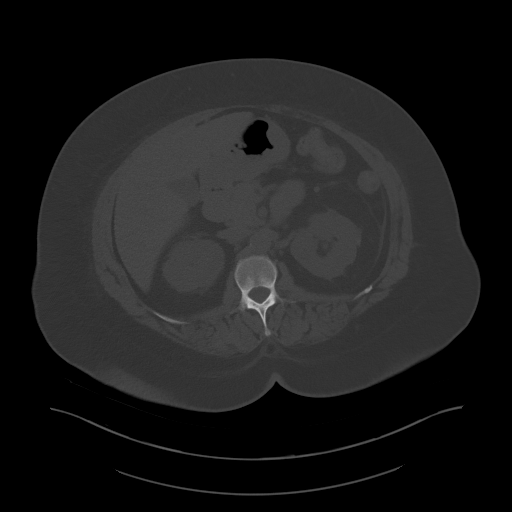
[im 76/103  soft-tissue]
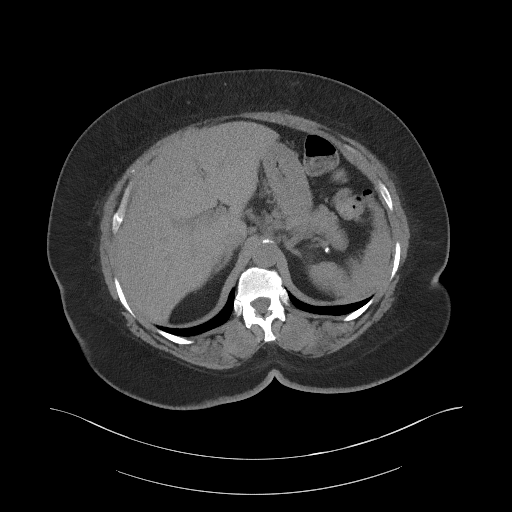
[im 80/103  soft-tissue]
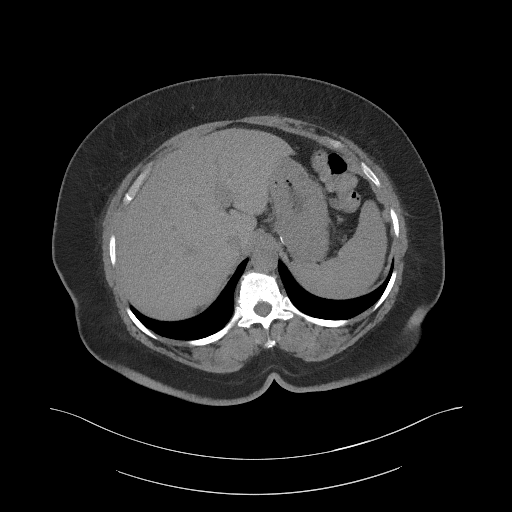
[im 89/103  soft-tissue]
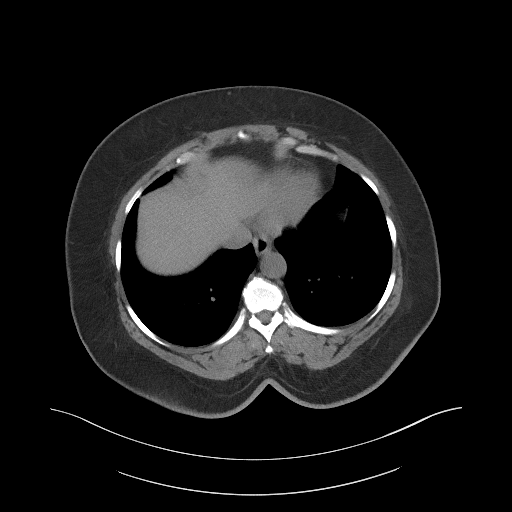
[im 98/103  soft-tissue]
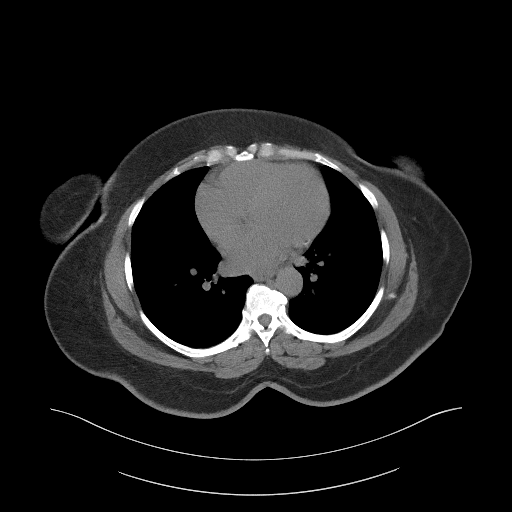

[Series 5: coronal st · coronal · 0.90mm/px · 3 of 125 slices shown]
[im 42/125  soft-tissue]
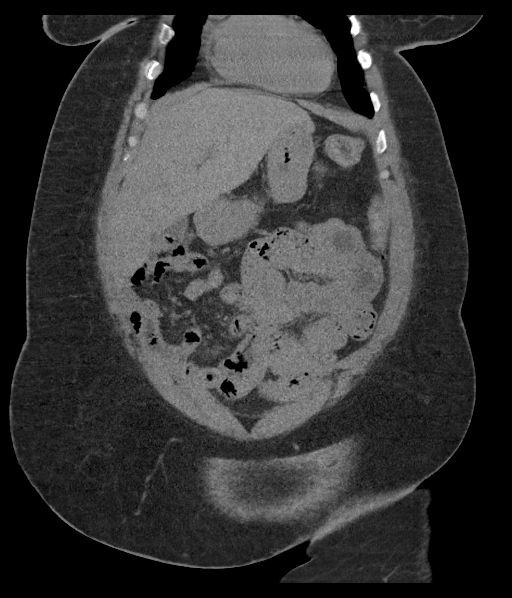
[im 56/125  soft-tissue]
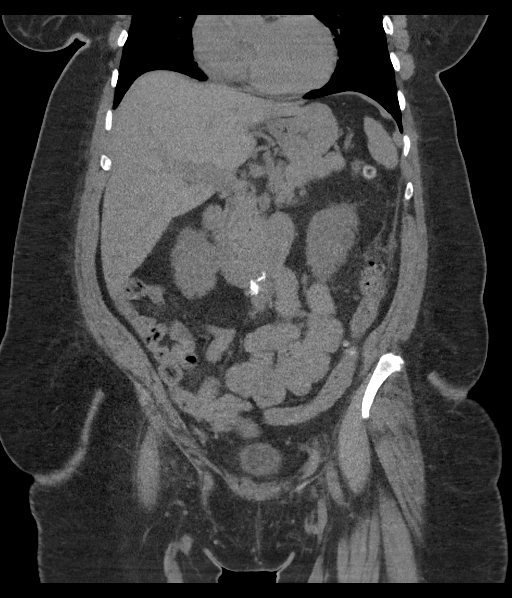
[im 69/125  soft-tissue]
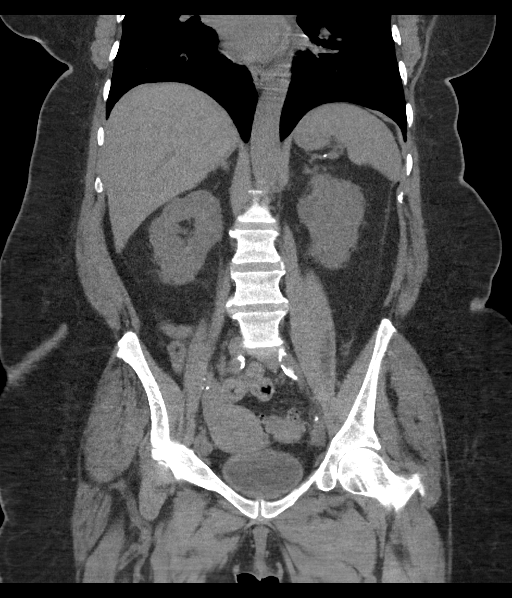

[16 of 46 positions shown; findings below may reference images not displayed]

FINDINGS: Lower chest:  Coronary atherosclerotic calcification

Hepatobiliary: No focal liver abnormality.No evidence of biliary
obstruction or stone.

Pancreas: Unremarkable.

Spleen: Unremarkable.

Adrenals/Urinary Tract: Negative adrenals. No hydronephrosis or
ureteral stone. 17 mm left lower pole calculus. Small cystic density
in the lower pole left kidney. Unremarkable bladder.

Stomach/Bowel: Numerous colonic diverticula distally. A descending
colonic diverticulum is thickened and there is regional fat
inflammation and peritoneal thickening. No abscess or
pneumoperitoneum

Right hemicolectomy.  Negative for bowel obstruction

Vascular/Lymphatic: Atherosclerotic calcification of the aorta and
iliacs. No mass or adenopathy.

Reproductive:No pathologic findings.

Other: No ascites or pneumoperitoneum.  Fatty right inguinal hernia

Musculoskeletal: No acute abnormalities. Spinal degeneration with
mild L4-5 anterolisthesis.
IMPRESSION: 1. Uncomplicated descending diverticulitis.
2. Left colonic diverticulosis is extensive. There has been right
hemicolectomy.
3. 17 mm left renal calculus.
4. Atherosclerosis, including the coronary arteries.
5. Fatty right inguinal hernia.
